# Patient Record
Sex: Female | Born: 1985 | Race: White | Hispanic: No | Marital: Married | Smoking: Current some day smoker
Health system: Southern US, Community
[De-identification: ages and names within clinical notes are randomized; demographics above are authoritative.]

## PROBLEM LIST (undated history)

## (undated) DIAGNOSIS — J45909 Unspecified asthma, uncomplicated: Secondary | ICD-10-CM

## (undated) DIAGNOSIS — F419 Anxiety disorder, unspecified: Secondary | ICD-10-CM

## (undated) DIAGNOSIS — K449 Diaphragmatic hernia without obstruction or gangrene: Secondary | ICD-10-CM

## (undated) DIAGNOSIS — F32A Depression, unspecified: Secondary | ICD-10-CM

## (undated) DIAGNOSIS — D649 Anemia, unspecified: Secondary | ICD-10-CM

## (undated) HISTORY — PX: LAPAROSCOPY: SHX197

## (undated) HISTORY — DX: Anxiety disorder, unspecified: F41.9

## (undated) HISTORY — PX: TUBAL LIGATION: SHX77

## (undated) HISTORY — DX: Depression, unspecified: F32.A

## (undated) HISTORY — PX: CHOLECYSTECTOMY: SHX55

---
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
## 2012-07-01 ENCOUNTER — Other Ambulatory Visit: Payer: Self-pay

## 2012-07-11 ENCOUNTER — Other Ambulatory Visit (HOSPITAL_COMMUNITY): Payer: Self-pay | Admitting: *Deleted

## 2012-07-11 ENCOUNTER — Encounter (HOSPITAL_COMMUNITY): Payer: Self-pay

## 2012-07-11 DIAGNOSIS — O280 Abnormal hematological finding on antenatal screening of mother: Secondary | ICD-10-CM

## 2012-07-16 ENCOUNTER — Encounter (HOSPITAL_COMMUNITY): Payer: Self-pay

## 2012-07-16 ENCOUNTER — Other Ambulatory Visit (HOSPITAL_COMMUNITY): Payer: Self-pay | Admitting: *Deleted

## 2012-07-16 ENCOUNTER — Other Ambulatory Visit: Payer: Self-pay

## 2012-07-16 ENCOUNTER — Ambulatory Visit (HOSPITAL_COMMUNITY)
Admission: RE | Admit: 2012-07-16 | Discharge: 2012-07-16 | Disposition: A | Discharge status: Home / Self Care | Payer: Medicaid Other | Source: Ambulatory Visit | Attending: *Deleted | Admitting: *Deleted

## 2012-07-16 ENCOUNTER — Ambulatory Visit (HOSPITAL_COMMUNITY)
Admission: RE | Admit: 2012-07-16 | Discharge: 2012-07-16 | Disposition: A | Discharge status: Home / Self Care | Payer: Medicaid Other | Source: Ambulatory Visit | Attending: Maternal and Fetal Medicine | Admitting: Maternal and Fetal Medicine

## 2012-07-16 ENCOUNTER — Ambulatory Visit (HOSPITAL_COMMUNITY)
Admission: RE | Admit: 2012-07-16 | Discharge: 2012-07-16 | Disposition: A | Discharge status: Home / Self Care | Payer: Medicaid Other | Source: Ambulatory Visit | Attending: Obstetrics and Gynecology | Admitting: Obstetrics and Gynecology

## 2012-07-16 DIAGNOSIS — Z363 Encounter for antenatal screening for malformations: Secondary | ICD-10-CM | POA: Insufficient documentation

## 2012-07-16 DIAGNOSIS — O280 Abnormal hematological finding on antenatal screening of mother: Secondary | ICD-10-CM

## 2012-07-16 DIAGNOSIS — O289 Unspecified abnormal findings on antenatal screening of mother: Secondary | ICD-10-CM | POA: Insufficient documentation

## 2012-07-16 DIAGNOSIS — Z1389 Encounter for screening for other disorder: Secondary | ICD-10-CM | POA: Insufficient documentation

## 2012-07-16 DIAGNOSIS — O358XX0 Maternal care for other (suspected) fetal abnormality and damage, not applicable or unspecified: Secondary | ICD-10-CM | POA: Insufficient documentation

## 2012-07-16 HISTORY — DX: Unspecified asthma, uncomplicated: J45.909

## 2012-07-16 NOTE — Progress Notes (Deleted)
Jo Jones  was seen today for an ultrasound appointment.  See full report in [facility].  Alpha Gula, MD  Single IUP at 25 4/7 weeks Follow up ultrasound for cervical length  TVUS reveals a cervical length of 1.2 cm with some V-shaped funneling noted (stable over last week)  Recommend course of betamethasone to accelerate fetal lung maturity Continue weekly 17-P injections Recommend follow up ultrasounds as clinically indicated

## 2012-07-16 NOTE — Progress Notes (Signed)
Genetic Counseling  High-Risk Gestation Note  Appointment Date:  07/16/2012 Referred By: Default, Provider, MD Date of Birth:  [birth year] Partner:  Jo Jones   Pregnancy History: [id number] Estimated Date of Delivery: 12/04/12 Estimated Gestational Age: [redacted]w[redacted]d Attending: Alpha Gula, MD  [name] and her husband, Mr. Jo Jones, were seen for genetic counseling because of an increased risk for fetal trisomy 18 based on maternal serum Quad screening through [facility].  They were counseled regarding the Quad screen result and the associated 1 in 31 risk for fetal trisomy 18.  We reviewed chromosomes, nondisjunction, and the common features and poor prognosis of trisomy 56.  In addition, we reviewed the screen adjusted reduction in risks for Down syndrome and ONTDs.  We also discussed other explanations for a screen positive result including: differences in maternal metabolism, and normal variation. They understand that this screening is not diagnostic for aneuploidy but provides a risk assessment.  We reviewed other available screening options including noninvasive prenatal testing (NIPT) and detailed ultrasound.  Specifically, we discussed that NIPT analyzes cell free fetal DNA found in the maternal circulation. This test is not diagnostic for chromosome conditions, but can provide information regarding the presence or absence of extra fetal DNA for chromosomes 13, 18, and 21. Thus, it would not identify or rule out all genetic conditions. The reported detection rate is greater than 99% for Trisomy 21, greater than 98% for Trisomy 18, and is approximately 80% (8 out of 10) for Trisomy 13. The false positive rate is reported to be less than 0.1% for any of these conditions.  In addition, we discussed that up to 90-95% of fetuses with trisomy 18, when well visualized, have detectable anomalies or soft markers by detailed ultrasound (~18+ weeks gestation).   This couple was also counseled  regarding diagnostic testing via amniocentesis.  We reviewed the approximate 1 in 300-500 risk for complications, including spontaneous pregnancy loss. After consideration of all the options, and a clear understanding of the newness and limitations of NIPT, they elected to proceed with cell free fetal DNA testing. Those results will be available in 8-10 days.  They also expressed interest in having a detailed ultrasound.  A complete ultrasound was performed today.  The ultrasound report will documented separately.  Jo Jones was provided with written information regarding cystic fibrosis (CF) including the carrier frequency and incidence in the Caucasian population, the availability of carrier testing and prenatal diagnosis if indicated.  In addition, we discussed that CF is routinely screened for as part of the [place] newborn screening panel.  She declined testing today.   Both family histories were reviewed and found to be noncontributory for birth defects, mental retardation, and known genetic conditions. Without further information regarding the provided family history, an accurate genetic risk cannot be calculated. Further genetic counseling is warranted if more information is obtained.  Jo Jones denied exposure to environmental toxins or chemical agents. She denied the use of alcohol, tobacco or street drugs. She denied significant viral illnesses during the course of her pregnancy.  I counseled this couple for approximately 38 minutes regarding the above risks and available options.     Donald Prose, MS  Certified Genetic Counselor

## 2012-07-16 NOTE — Progress Notes (Signed)
Jo Jones  was seen today for an ultrasound appointment.  See full report in [facility].  Alpha Gula, MD  Single IUP at 19 6/7 weeks Normal detailed fetal anatomy No markers associated with aneuploidy noted Normal amniotic fluid volume  After counseling, the patient elected to undergo cell free fetal DNA testing ([facility])  Recommend follow up ultrasounds as clinically indicated

## 2012-07-25 ENCOUNTER — Telehealth (HOSPITAL_COMMUNITY): Payer: Self-pay

## 2012-07-25 NOTE — Telephone Encounter (Signed)
Called Meredeth Ide to discuss her [facility], cell free fetal DNA testing. Testing was offered because of an increased risk for Trisomy 18 based on MSS. We reviewed that these are within normal limits, showing a less than 1 in 10,000 risk for trisomies 21, 18 and 13. We reviewed that this testing identifies > 99% of pregnancies with trisomy 21, >98% of pregnancies with trisomy 9, and >80% with trisomy 57; the false positive rate is <0.1% for all conditions. She understands that this testing does not identify all genetic conditions. All questions were answered to her satisfaction, she was encouraged to call with additional questions or concerns.  Despina Arias, MS Certified Genetic Counselor

## 2014-09-27 ENCOUNTER — Encounter (HOSPITAL_COMMUNITY): Payer: Self-pay

## 2022-03-22 ENCOUNTER — Emergency Department
Admission: EM | Admit: 2022-03-22 | Discharge: 2022-03-22 | Disposition: A | Discharge status: Home / Self Care | Payer: Self-pay | Source: Home / Self Care

## 2022-03-22 ENCOUNTER — Emergency Department (INDEPENDENT_AMBULATORY_CARE_PROVIDER_SITE_OTHER): Payer: Self-pay

## 2022-03-22 DIAGNOSIS — M545 Low back pain, unspecified: Secondary | ICD-10-CM

## 2022-03-22 DIAGNOSIS — S39012A Strain of muscle, fascia and tendon of lower back, initial encounter: Secondary | ICD-10-CM

## 2022-03-22 HISTORY — DX: Anemia, unspecified: D64.9

## 2022-03-22 HISTORY — DX: Diaphragmatic hernia without obstruction or gangrene: K44.9

## 2022-03-22 MED ORDER — METHYLPREDNISOLONE 4 MG PO TBPK: ORAL_TABLET | ORAL | 0 refills | Status: DC

## 2022-03-22 MED ORDER — BACLOFEN 10 MG PO TABS: 10.0000 mg | ORAL_TABLET | Freq: Three times a day (TID) | ORAL | 0 refills | Status: DC

## 2022-03-22 NOTE — ED Triage Notes (Signed)
Pt presents to Urgent Care with c/o back pain following an injury 2-3 days ago. States she got her foot caught on something and twisted her body, feeling pain and "cracking" in her lower back. Also reports problems w/ fainting episodes related to "bodily injury," and states she fainted last night.  ?

## 2022-03-22 NOTE — ED Provider Notes (Signed)
?[facility] ? ? ? ?CSN: [id number] ?Arrival date & time: 03/22/22  1003 ? ? ?  ? ?History   ?Chief Complaint ?Chief Complaint  ?Patient presents with  ? Loss of Consciousness  ? Back Pain  ? Back Injury  ? ? ?HPI ?DESHAWNA MCNEECE is a 36 y.o. female.  ? ?HPI 36 year old female presents with back pain following injury 2 to 3 days ago.  Patient reports got her foot caught under something and then twisted her body feeling pain and a crack in her lower back.  Additionally, patient reports fainting episodes related to bodily injury and fainted last night.  It is accompanied by her mother today.  PMH significant for anemia and asthma. ? ?Past Medical History:  ?Diagnosis Date  ? Anemia   ? Asthma   ? Hiatal hernia   ? ? ?There are no problems to display for this patient. ? ? ?Past Surgical History:  ?Procedure Laterality Date  ? CHOLECYSTECTOMY    ? LAPAROSCOPY    ? TUBAL LIGATION    ? ? ?OB History   ? ? Gravida  ?3  ? Para  ?2  ? Term  ?2  ? Preterm  ?0  ? AB  ?0  ? Living  ?2  ?  ? ? SAB  ?0  ? IAB  ?0  ? Ectopic  ?0  ? Multiple  ?0  ? Live Births  ?   ?   ?  ?  ? ? ? ?Home Medications   ? ?Prior to Admission medications   ?Medication Sig Start Date End Date Taking? Authorizing Provider  ?ALPRAZolam (XANAX) 0.5 MG tablet Take 0.5 mg by mouth at bedtime as needed for anxiety.   Yes [provider]  ?aspirin 325 MG tablet Take 325 mg by mouth daily.   Yes [provider]  ?baclofen (LIORESAL) 10 MG tablet Take 1 tablet (10 mg total) by mouth 3 (three) times daily. 03/22/22  Yes Trevor Iha, FNP  ?methylPREDNISolone (MEDROL DOSEPAK) 4 MG TBPK tablet Take as directed 03/22/22  Yes Trevor Iha, FNP  ?esomeprazole (NEXIUM) 40 MG capsule Take by mouth.    [provider]  ?traMADol (ULTRAM) 50 MG tablet Take by mouth.    [provider]  ? ? ?Family History ?Family History  ?Problem Relation Age of Onset  ? Diabetes Mother   ? Cancer Mother   ? Hypertension Father   ?  Pulmonary embolism Father   ? ? ?Social History ?Social History  ? ?Tobacco Use  ? Smoking status: Former  ?  Types: Cigarettes  ? Smokeless tobacco: Never  ?Vaping Use  ? Vaping Use: Every day  ?Substance Use Topics  ? Alcohol use: Yes  ?  Comment: rarely  ? ? ? ?Allergies   ?Morphine and related, Sulfa antibiotics, and Amoxicillin ? ? ?Review of Systems ?Review of Systems  ?Musculoskeletal:  Positive for back pain.  ? ? ?Physical Exam ?Triage Vital Signs ?ED Triage Vitals  ?Enc Vitals Group  ?   BP 03/22/22 1033 (!) 134/99  ?   Pulse Rate 03/22/22 1033 86  ?   Resp 03/22/22 1033 20  ?   Temp 03/22/22 1033 98.3 ?F (36.8 ?C)  ?   Temp Source 03/22/22 1033 Oral  ?   SpO2 03/22/22 1033 97 %  ?   Weight 03/22/22 1030 118 lb (53.5 kg)  ?   Height 03/22/22 1030 5\' 3"  (1.6 m)  ?   Head  Circumference --   ?   Peak Flow --   ?   Pain Score 03/22/22 1029 5  ?   Pain Loc --   ?   Pain Edu? --   ?   Excl. in GC? --   ? ?No data found. ? ?Updated Vital Signs ?BP 117/74 (BP Location: Right Arm)   Pulse 86   Temp 98.3 ?F (36.8 ?C) (Oral)   Resp 20   Ht 5\' 3"  (1.6 m)   Wt 118 lb (53.5 kg)   LMP 03/13/2022   SpO2 97%   BMI 20.90 kg/m?  ? ?Physical Exam ?Vitals and nursing note reviewed.  ?Constitutional:   ?   Appearance: Normal appearance. She is normal weight.  ?HENT:  ?   Head: Normocephalic and atraumatic.  ?   Mouth/Throat:  ?   Mouth: Mucous membranes are moist.  ?   Pharynx: Oropharynx is clear.  ?Eyes:  ?   Extraocular Movements: Extraocular movements intact.  ?   Conjunctiva/sclera: Conjunctivae normal.  ?   Pupils: Pupils are equal, round, and reactive to light.  ?Cardiovascular:  ?   Rate and Rhythm: Normal rate and regular rhythm.  ?   Pulses: Normal pulses.  ?   Heart sounds: Normal heart sounds.  ?Pulmonary:  ?   Effort: Pulmonary effort is normal.  ?   Breath sounds: Normal breath sounds. No wheezing, rhonchi or rales.  ?Musculoskeletal:  ?   Cervical back: Normal range of motion and neck supple.  ?    Comments: Lumbar sacral spine: TTP over L5-S1 region, bilateral paraspinous muscles, bilateral superior spinal erectors with palpable muscle adhesions noted  ?Skin: ?   General: Skin is warm and dry.  ?Neurological:  ?   General: No focal deficit present.  ?   Mental Status: She is alert and oriented to person, place, and time.  ? ? ? ?UC Treatments / Results  ?Labs ?(all labs ordered are listed, but only abnormal results are displayed) ?Labs Reviewed - No data to display ? ?EKG ? ? ?Radiology ?DG Lumbar Spine Complete ? ?Result Date: 03/22/2022 ?CLINICAL DATA:  Lower back pain. Back injury 2-3 days ago. Pain radiates down the bilateral femurs. EXAM: LUMBAR SPINE - COMPLETE 4+ VIEW COMPARISON:  None. FINDINGS: There are small ribs at the vertebral body considered T12. Distal to this, the next 5 vertebral bodies are considered L1 through L5. Normal frontal alignment. Normal sagittal alignment. Vertebral body heights and intervertebral disc spaces are maintained. No pars defect is identified. Cholecystectomy clips are noted. The visualized sacroiliac joints are unremarkable. IMPRESSION: Normal lumbar spine radiographs. Electronically Signed   By: Neita Garnetonald  Viola M.D.   On: 03/22/2022 12:20   ? ?Procedures ?Procedures (including critical care time) ? ?Medications Ordered in UC ?Medications - No data to display ? ?Initial Impression / Assessment and Plan / UC Course  ?I have reviewed the triage vital signs and the nursing notes. ? ?Pertinent labs & imaging results that were available during my care of the patient were reviewed by me and considered in my medical decision making (see chart for details). ? ?  ? ?MDM: 1.  Strain of lumbar region, initial encounter-Rx'd Medrol Dosepak and Baclofen. Advised/informed patient of LS spine x-ray results today with hard copy provided to patient.  Instructed patient to take medication as directed with food to completion.  Advised may take baclofen daily or as needed for accompanying  muscle spasms of back.  Encouraged patient to increase daily  water intake while taking these medications.  Advised patient if symptoms worsen and/or unresolved please follow-up with PCP or here for further evaluation.  Discharged home, hemodynamically stable. ?Final Clinical Impressions(s) / UC Diagnoses  ? ?Final diagnoses:  ?Strain of lumbar region, initial encounter  ? ? ? ?Discharge Instructions   ? ?  ?Advised/informed patient of LS spine x-ray results today with hard copy provided to patient.  Instructed patient to take medication as directed with food to completion.  Advised may take Baclofen daily or as needed for accompanying muscle spasms of back.  Encouraged patient to increase daily water intake while taking these medications.  Advised patient if symptoms worsen and/or unresolved please follow-up with PCP or here for further evaluation. ? ? ? ? ?ED Prescriptions   ? ? Medication Sig Dispense Auth. Provider  ? methylPREDNISolone (MEDROL DOSEPAK) 4 MG TBPK tablet Take as directed 1 each Trevor Iha, FNP  ? baclofen (LIORESAL) 10 MG tablet Take 1 tablet (10 mg total) by mouth 3 (three) times daily. 30 each Trevor Iha, FNP  ? ?  ? ?PDMP not reviewed this encounter. ?  ?Trevor Iha, FNP ?03/22/22 1238 ? ?

## 2022-03-22 NOTE — Discharge Instructions (Addendum)
Advised/informed patient of LS spine x-ray results today with hard copy provided to patient.  Instructed patient to take medication as directed with food to completion.  Advised may take Baclofen daily or as needed for accompanying muscle spasms of back.  Encouraged patient to increase daily water intake while taking these medications.  Advised patient if symptoms worsen and/or unresolved please follow-up with PCP or here for further evaluation. ?

## 2022-07-05 NOTE — Progress Notes (Signed)
New Patient Office Visit  Subjective    Patient ID: Jo Jones, female    DOB: [birth year]  Age: 36 y.o. MRN: [id number]  CC:  Chief Complaint  Patient presents with   Establish Care    HPI Jo Jones presents to establish care. She has concerns today about her hiatal hernia. She has tried nexium and pepcid which used to work but is no longer helping. She has lost 10lbs in the last week due to pain.   Outpatient Encounter Medications as of 07/06/2022  Medication Sig   ALPRAZolam (XANAX) 0.5 MG tablet Take 0.5 mg by mouth at bedtime as needed for anxiety.   aspirin 325 MG tablet Take 325 mg by mouth daily.   famotidine (PEPCID) 20 MG tablet Take 20 mg by mouth daily.   omeprazole (PRILOSEC) 40 MG capsule Take 1 capsule (40 mg total) by mouth daily.   traMADol (ULTRAM) 50 MG tablet Take by mouth.   [DISCONTINUED] esomeprazole (NEXIUM) 40 MG capsule Take by mouth.   [DISCONTINUED] baclofen (LIORESAL) 10 MG tablet Take 1 tablet (10 mg total) by mouth 3 (three) times daily. (Patient not taking: Reported on 07/06/2022)   [DISCONTINUED] methylPREDNISolone (MEDROL DOSEPAK) 4 MG TBPK tablet Take as directed (Patient not taking: Reported on 07/06/2022)   No facility-administered encounter medications on file as of 07/06/2022.    Past Medical History:  Diagnosis Date   Anemia    Anxiety    Asthma    Depression    Hiatal hernia     Past Surgical History:  Procedure Laterality Date   CHOLECYSTECTOMY     LAPAROSCOPY     TUBAL LIGATION      Family History  Problem Relation Age of Onset   Diabetes Mother    Cancer Mother    Hypertension Father    Pulmonary embolism Father    Heart attack Other    Stroke Other    Cancer - Ovarian Other    Breast cancer Other     Social History   Socioeconomic History   Marital status: Married    Spouse name: Not on file   Number of children: Not on file   Years of education: Not on file   Highest education level: Not on file   Occupational History   Not on file  Tobacco Use   Smoking status: Some Days    Types: Cigarettes   Smokeless tobacco: Current  Vaping Use   Vaping Use: Every day  Substance and Sexual Activity   Alcohol use: Yes    Comment: rarely   Drug use: Yes    Types: Marijuana    Comment: former user   Sexual activity: Yes  Other Topics Concern   Not on file  Social History Narrative   Not on file   Social Determinants of Health   Financial Resource Strain: Not on file  Food Insecurity: Not on file  Transportation Needs: Not on file  Physical Activity: Not on file  Stress: Not on file  Social Connections: Not on file  Intimate Partner Violence: Not on file    Review of Systems  Constitutional:  Negative for chills and fever.  Respiratory:  Negative for cough and shortness of breath.   Cardiovascular:  Negative for chest pain.  Gastrointestinal:  Positive for heartburn.  Neurological:  Negative for headaches.        Objective    BP 115/75   Pulse 83   Ht 5\' 3"  (1.6 m)  Wt 118 lb (53.5 kg)   SpO2 99%   BMI 20.90 kg/m   Physical Exam Vitals and nursing note reviewed.  Constitutional:      General: She is not in acute distress.    Appearance: Normal appearance.  HENT:     Head: Normocephalic and atraumatic.     Right Ear: External ear normal.     Left Ear: External ear normal.     Nose: Nose normal.  Eyes:     Conjunctiva/sclera: Conjunctivae normal.  Cardiovascular:     Rate and Rhythm: Normal rate and regular rhythm.  Pulmonary:     Effort: Pulmonary effort is normal.     Breath sounds: Normal breath sounds.  Abdominal:     General: Abdomen is flat. Bowel sounds are normal.     Palpations: Abdomen is soft.  Neurological:     General: No focal deficit present.     Mental Status: She is alert and oriented to person, place, and time.  Psychiatric:        Mood and Affect: Mood normal.        Behavior: Behavior normal.        Thought Content: Thought  content normal.        Judgment: Judgment normal.        Assessment & Plan:   Problem List Items Addressed This Visit       Respiratory   Hiatal hernia    - pt symptoms being uncontrolled with nexium and pepcid. Will do a trial of omeprazole to see if this can improve symptoms  - recommended bland foods and counseled on continued avoidance of spicy foods, peppermint, coffee  - sent referral to GI for EGD to assess for hiatal hernia      Relevant Medications   omeprazole (PRILOSEC) 40 MG capsule   Other Relevant Orders   Ambulatory referral to Gastroenterology     Other   Weight loss - Primary    - secondary to not eating due to pain - abdominal exam normal - will order CBC, CMP to assess for underlying infection and elyte abnormalities as well as kidney function  - recommend eating smaller meals as to not exacerbate hernia symptoms      Relevant Orders   CBC   COMPLETE METABOLIC PANEL WITH GFR   History of iron deficiency anemia    - pt recently started back on her iron supplements - counseled on fiber intake to reduce constipation  - will order iron studies to see where her iron levels are and see if we need to increase therapy      Relevant Orders   Fe+TIBC+Fer    Return in about 2 months (around 09/05/2022) for wellness exam.   Charlton Amor, DO

## 2022-07-06 ENCOUNTER — Encounter: Payer: Self-pay | Admitting: Family Medicine

## 2022-07-06 ENCOUNTER — Ambulatory Visit (INDEPENDENT_AMBULATORY_CARE_PROVIDER_SITE_OTHER): Payer: Medicaid Other | Admitting: Family Medicine

## 2022-07-06 VITALS — BP 115/75 | HR 83 | Ht 63.0 in | Wt 118.0 lb

## 2022-07-06 DIAGNOSIS — R634 Abnormal weight loss: Secondary | ICD-10-CM | POA: Diagnosis not present

## 2022-07-06 DIAGNOSIS — Z862 Personal history of diseases of the blood and blood-forming organs and certain disorders involving the immune mechanism: Secondary | ICD-10-CM | POA: Diagnosis not present

## 2022-07-06 DIAGNOSIS — K449 Diaphragmatic hernia without obstruction or gangrene: Secondary | ICD-10-CM | POA: Diagnosis not present

## 2022-07-06 MED ORDER — OMEPRAZOLE 40 MG PO CPDR: 40.0000 mg | DELAYED_RELEASE_CAPSULE | Freq: Every day | ORAL | 0 refills | Status: AC

## 2022-07-06 NOTE — Assessment & Plan Note (Addendum)
-   secondary to not eating due to pain - abdominal exam normal - will order CBC, CMP to assess for underlying infection and elyte abnormalities as well as kidney function  - recommend eating smaller meals as to not exacerbate hernia symptoms

## 2022-07-06 NOTE — Assessment & Plan Note (Signed)
-   pt symptoms being uncontrolled with nexium and pepcid. Will do a trial of omeprazole to see if this can improve symptoms  - recommended bland foods and counseled on continued avoidance of spicy foods, peppermint, coffee  - sent referral to GI for EGD to assess for hiatal hernia

## 2022-07-06 NOTE — Assessment & Plan Note (Signed)
-   pt recently started back on her iron supplements - counseled on fiber intake to reduce constipation  - will order iron studies to see where her iron levels are and see if we need to increase therapy

## 2022-07-07 LAB — COMPLETE METABOLIC PANEL WITH GFR
AG Ratio: 1.8 (calc) (ref 1.0–2.5)
ALT: 13 U/L (ref 6–29)
AST: 20 U/L (ref 10–30)
Albumin: 4.5 g/dL (ref 3.6–5.1)
Alkaline phosphatase (APISO): 52 U/L (ref 31–125)
BUN: 11 mg/dL (ref 7–25)
CO2: 29 mmol/L (ref 20–32)
Calcium: 9.5 mg/dL (ref 8.6–10.2)
Chloride: 106 mmol/L (ref 98–110)
Creat: 0.78 mg/dL (ref 0.50–0.97)
Globulin: 2.5 g/dL (calc) (ref 1.9–3.7)
Glucose, Bld: 84 mg/dL (ref 65–99)
Potassium: 4.2 mmol/L (ref 3.5–5.3)
Sodium: 139 mmol/L (ref 135–146)
Total Bilirubin: 0.5 mg/dL (ref 0.2–1.2)
Total Protein: 7 g/dL (ref 6.1–8.1)
eGFR: 100 mL/min/{1.73_m2} (ref >=60)

## 2022-07-07 LAB — IRON,TIBC AND FERRITIN PANEL
%SAT: 30 % (calc) (ref 16–45)
Ferritin: 9 ng/mL — ABNORMAL LOW (ref 16–154)
Iron: 132 ug/dL (ref 40–190)
TIBC: 444 mcg/dL (calc) (ref 250–450)

## 2022-07-07 LAB — CBC
HCT: 43.4 % (ref 35.0–45.0)
Hemoglobin: 14.4 g/dL (ref 11.7–15.5)
MCH: 29 pg (ref 27.0–33.0)
MCHC: 33.2 g/dL (ref 32.0–36.0)
MCV: 87.3 fL (ref 80.0–100.0)
MPV: 11.8 fL (ref 7.5–12.5)
Platelets: 249 10*3/uL (ref 140–400)
RBC: 4.97 10*6/uL (ref 3.80–5.10)
RDW: 14.3 % (ref 11.0–15.0)
WBC: 7 10*3/uL (ref 3.8–10.8)

## 2022-09-05 ENCOUNTER — Encounter: Payer: Self-pay | Admitting: Family Medicine

## 2022-09-05 ENCOUNTER — Ambulatory Visit (INDEPENDENT_AMBULATORY_CARE_PROVIDER_SITE_OTHER): Payer: Self-pay | Admitting: Family Medicine

## 2022-09-05 ENCOUNTER — Other Ambulatory Visit (HOSPITAL_COMMUNITY)
Admission: RE | Admit: 2022-09-05 | Discharge: 2022-09-05 | Disposition: A | Discharge status: Home / Self Care | Payer: Medicaid Other | Source: Ambulatory Visit | Attending: Family Medicine | Admitting: Family Medicine

## 2022-09-05 VITALS — BP 110/70 | HR 63 | Ht 63.0 in | Wt 115.0 lb

## 2022-09-05 DIAGNOSIS — Z124 Encounter for screening for malignant neoplasm of cervix: Secondary | ICD-10-CM | POA: Diagnosis present

## 2022-09-05 DIAGNOSIS — Z0000 Encounter for general adult medical examination without abnormal findings: Secondary | ICD-10-CM

## 2022-09-05 DIAGNOSIS — Z13220 Encounter for screening for lipoid disorders: Secondary | ICD-10-CM

## 2022-09-05 NOTE — Progress Notes (Signed)
Established patient visit   Patient: Jo Jones   DOB: [birth year]   36 y.o. Female  MRN: [id number] Visit Date: 09/05/2022  Today's healthcare provider: Owens Loffler, DO   Chief Complaint  Patient presents with   Employment Physical    SUBJECTIVE    Chief Complaint  Patient presents with   Employment Physical   HPI  Pt is here for well wellness.   Diet: eating well  Exercise: has been doing more core work    Screenings: Pap smears: she is due for pap smear, it has been a while    Family hx: HTN: mom and dad  DM: dad Cancer: mom-cervical cancer, great grandmother--cervical cancer  Social hx: Alcohol use: no  Tobacco (chew, smoke): vape mostly; cartridge last her two weeks, was smoking but for year  Sexually active: yes,  married  Who do you live with: husband, two sons, and 107 year old step son  House or apt? House  Safe at home: yes   Review of Systems  Constitutional:  Negative for activity change, fatigue and fever.  Respiratory:  Negative for cough and shortness of breath.   Cardiovascular:  Negative for chest pain.  Gastrointestinal:  Negative for abdominal pain.  Genitourinary:  Negative for difficulty urinating.      Current Meds  Medication Sig   aspirin 325 MG tablet Take 325 mg by mouth daily.   famotidine (PEPCID) 20 MG tablet Take 20 mg by mouth daily.   omeprazole (PRILOSEC) 40 MG capsule Take 1 capsule (40 mg total) by mouth daily.   traMADol (ULTRAM) 50 MG tablet Take by mouth.    OBJECTIVE    BP 110/70   Pulse 63   Ht 5\' 3"  (1.6 m)   Wt 115 lb (52.2 kg)   SpO2 100%   BMI 20.37 kg/m   Physical Exam Vitals and nursing note reviewed.  Constitutional:      General: She is not in acute distress.    Appearance: Normal appearance.  HENT:     Head: Normocephalic and atraumatic.     Right Ear: External ear normal.     Left Ear: External ear normal.     Nose: Nose normal.  Eyes:     Conjunctiva/sclera: Conjunctivae  normal.  Cardiovascular:     Rate and Rhythm: Normal rate and regular rhythm.  Pulmonary:     Effort: Pulmonary effort is normal.     Breath sounds: Normal breath sounds.  Abdominal:     General: Abdomen is flat. Bowel sounds are normal.     Palpations: Abdomen is soft.     Tenderness: There is no abdominal tenderness.  Genitourinary:    Comments: Chaperone present during speculum exam. Cervix was well visualized. Some friability of the cervix noted. No adnexal masses or lesions noted in the vaginal vault Musculoskeletal:     Comments: 5/5 muscle strength upper and lower extremity bilaterally  Skin:    General: Skin is warm.  Neurological:     General: No focal deficit present.     Mental Status: She is alert and oriented to person, place, and time.  Psychiatric:        Mood and Affect: Mood normal.        Behavior: Behavior normal.        Thought Content: Thought content normal.        Judgment: Judgment normal.          ASSESSMENT & PLAN  Problem List Items Addressed This Visit       Other   Routine adult health maintenance - Primary    - pap smear done today  - CBC and CMP UTD  - will get lipid panel today to assess for lipid disorders      Other Visit Diagnoses     Screening for cervical cancer       Relevant Orders   Cytology - PAP   Screening for lipid disorders       Relevant Orders   Lipid panel       Return in about 1 year (around 09/06/2023) for wellness.      No orders of the defined types were placed in this encounter.   Orders Placed This Encounter  Procedures   Lipid panel    Order Specific Question:   Has the patient fasted?    Answer:   No    Order Specific Question:   Release to patient    Answer:   Immediate     Charlton Amor, DO  [facility] [phone] (phone) [phone] (fax)  [facility]

## 2022-09-05 NOTE — Assessment & Plan Note (Signed)
-   pap smear done today  - CBC and CMP UTD  - will get lipid panel today to assess for lipid disorders

## 2022-09-06 LAB — LIPID PANEL
Cholesterol: 177 mg/dL (ref <200)
HDL: 74 mg/dL (ref >=50)
LDL Cholesterol (Calc): 89 mg/dL (calc)
Non-HDL Cholesterol (Calc): 103 mg/dL (calc) (ref <130)
Total CHOL/HDL Ratio: 2.4 (calc) (ref <5.0)
Triglycerides: 57 mg/dL (ref <150)

## 2022-09-06 LAB — CYTOLOGY - PAP
Comment: NEGATIVE
Diagnosis: NEGATIVE
High risk HPV: NEGATIVE

## 2022-09-24 IMAGING — DX DG LUMBAR SPINE COMPLETE 4+V
5 series · 5 of 5 positions shown · non-contrast
Comparison: None.

CLINICAL DATA: Lower back pain. Back injury 2-3 days ago. Pain
radiates down the bilateral femurs.

EXAM:
LUMBAR SPINE - COMPLETE 4+ VIEW

[l-spine ap]
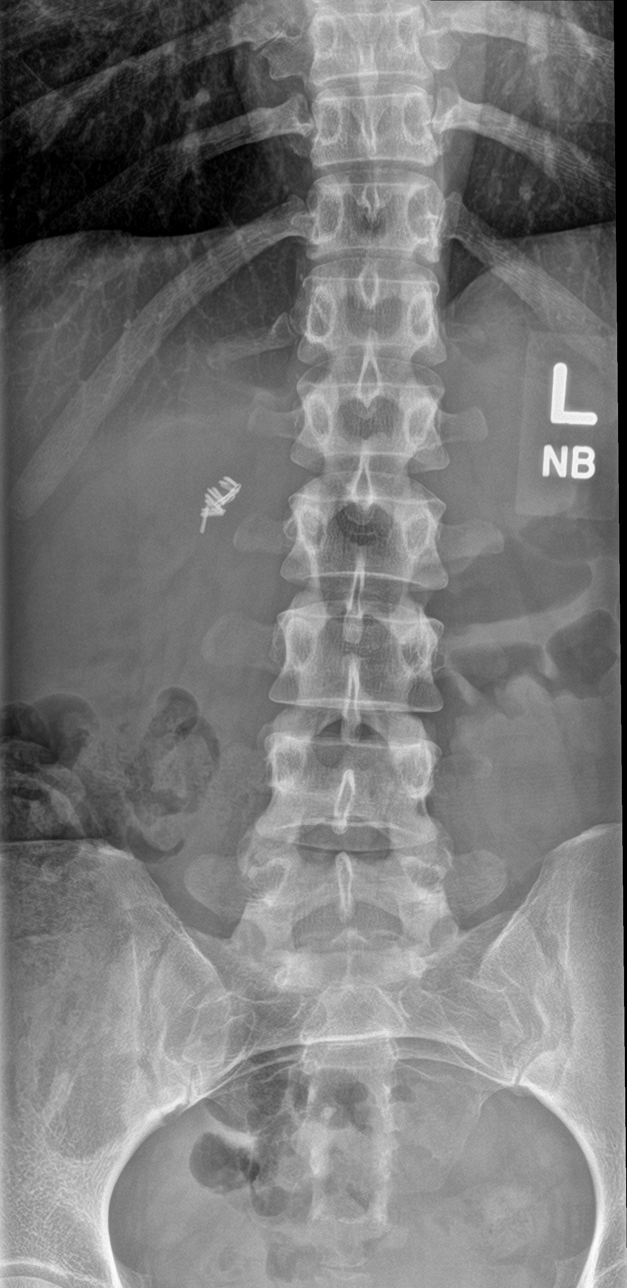

[l-spine obl (1 of 2)]
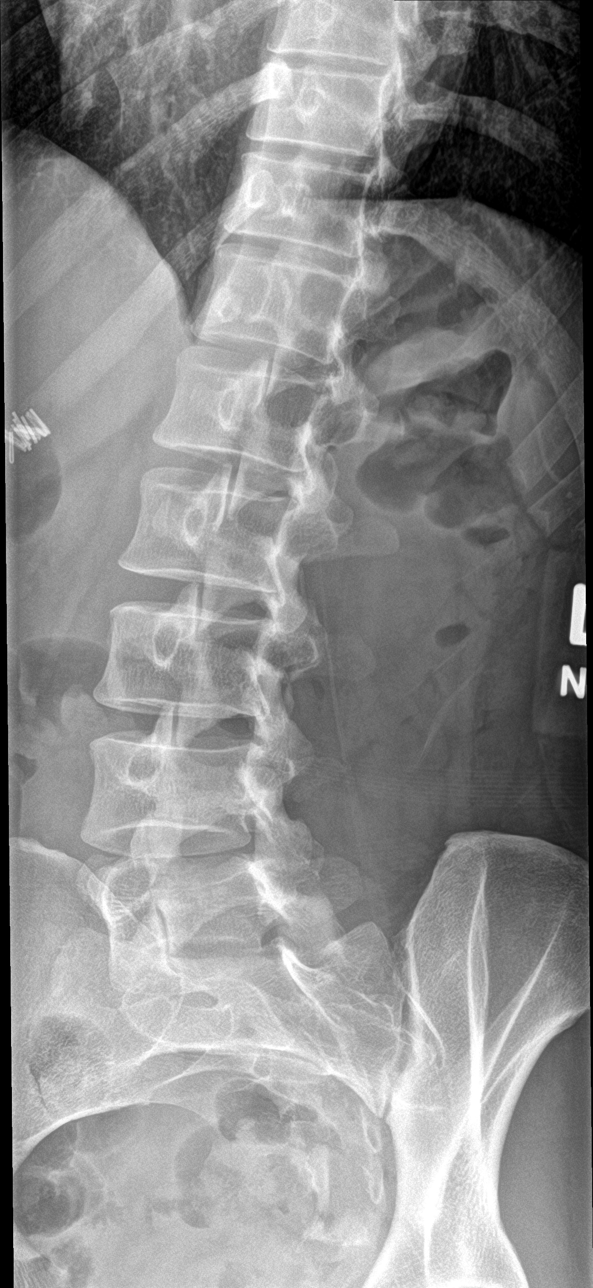

[l-spine obl (2 of 2)]
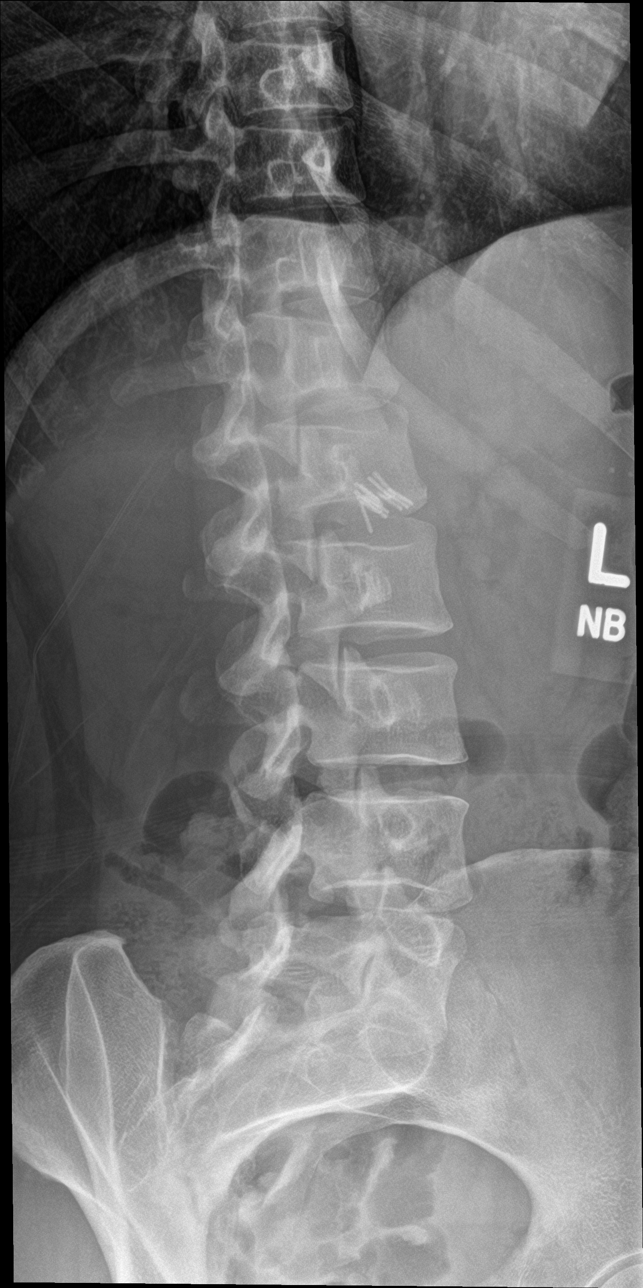

[l-spine lat]
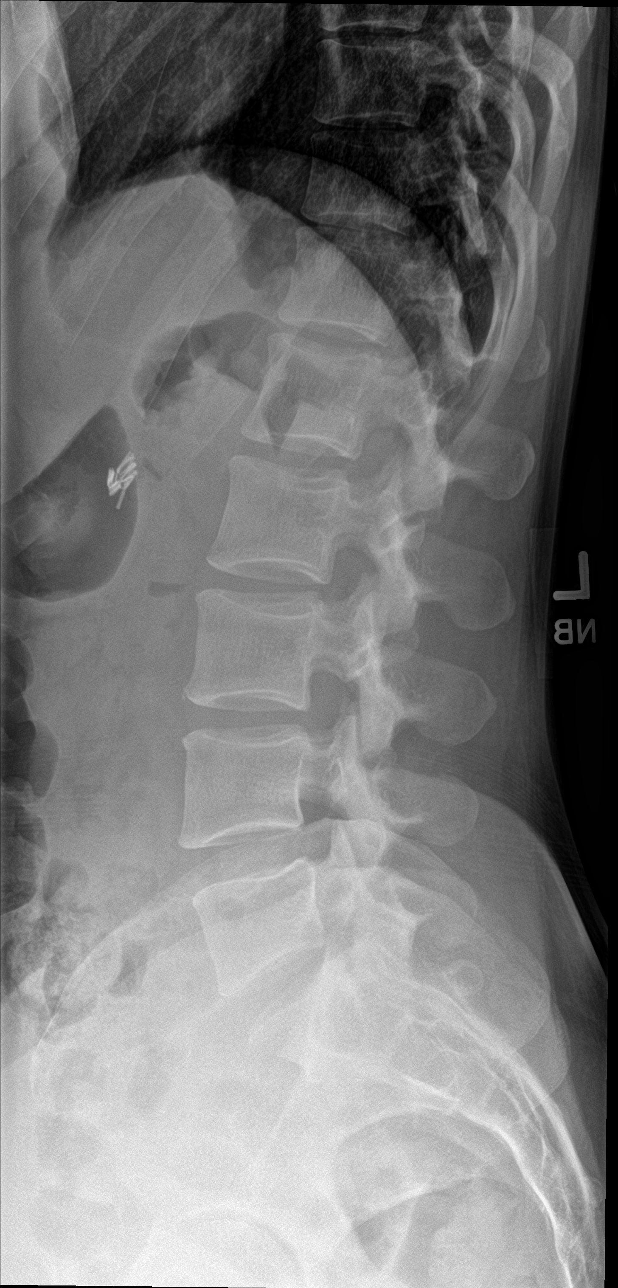

[l-spine spot]
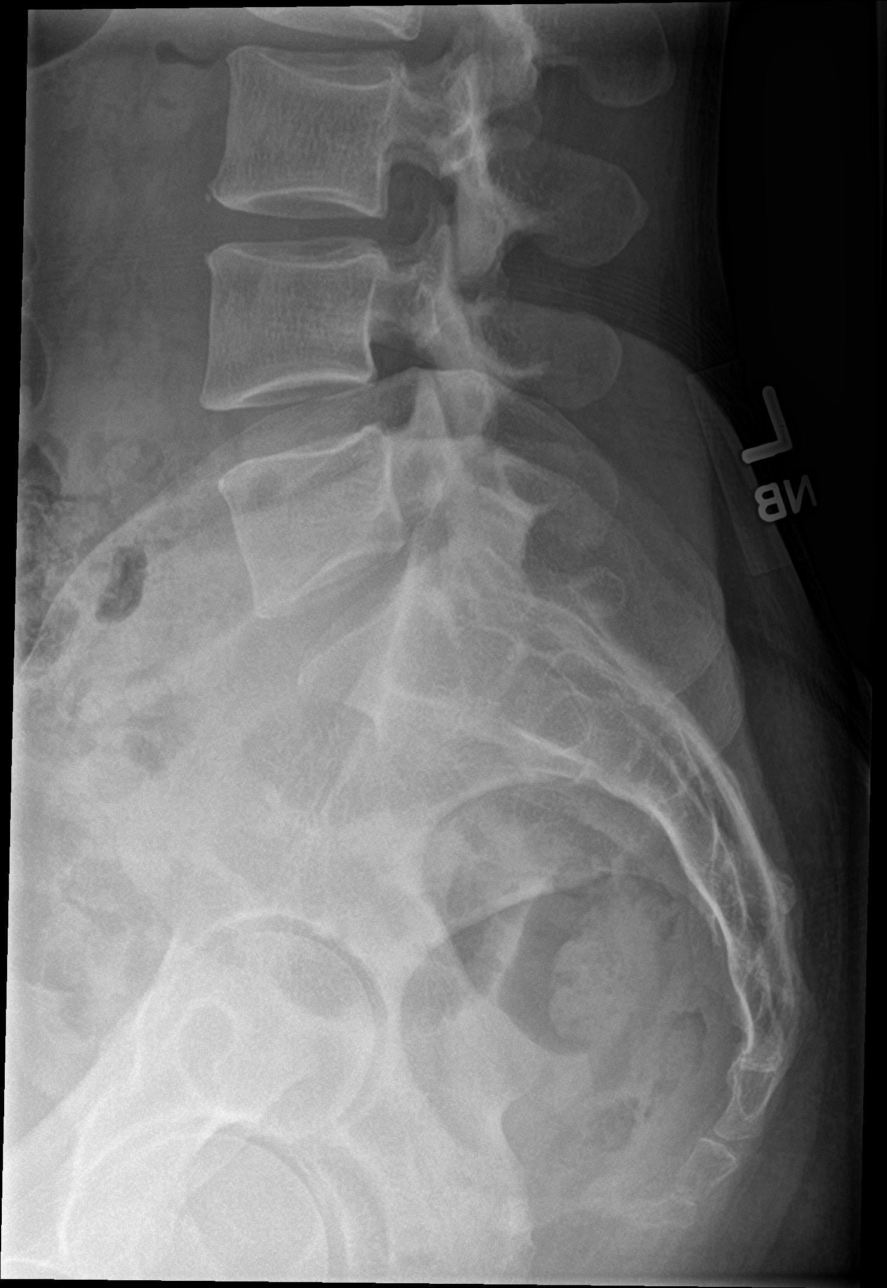

[5 of 5 positions shown; findings below may reference images not displayed]

FINDINGS: There are small ribs at the vertebral body considered T12. Distal to
this, the next 5 vertebral bodies are considered L1 through L5.
Normal frontal alignment. Normal sagittal alignment. Vertebral body
heights and intervertebral disc spaces are maintained. No pars
defect is identified. Cholecystectomy clips are noted. The
visualized sacroiliac joints are unremarkable.
IMPRESSION: Normal lumbar spine radiographs.

## 2024-04-02 ENCOUNTER — Encounter: Payer: Self-pay | Admitting: Family Medicine
